# Patient Record
Sex: Female | Born: 2005 | Race: Black or African American | Hispanic: No | Marital: Single | Smoking: Never smoker
Health system: Southern US, Community
[De-identification: ages and names within clinical notes are randomized; demographics above are authoritative.]

## PROBLEM LIST (undated history)

## (undated) ENCOUNTER — Inpatient Hospital Stay (HOSPITAL_COMMUNITY): Payer: Self-pay

## (undated) DIAGNOSIS — A749 Chlamydial infection, unspecified: Secondary | ICD-10-CM

## (undated) DIAGNOSIS — F32A Depression, unspecified: Secondary | ICD-10-CM

## (undated) DIAGNOSIS — F419 Anxiety disorder, unspecified: Secondary | ICD-10-CM

---
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
## 2023-03-11 IMAGING — CT CT ANGIO CHEST
2 of 6 series · 18 of 36 positions shown · IV contrast (OMNIPAQUE 350)
Comparison: None.

CLINICAL DATA: Chest pain and shortness of breath at rest since
[REDACTED]. Positive D-dimer.

EXAM:
CT ANGIOGRAPHY CHEST WITH CONTRAST
TECHNIQUE: Multidetector CT imaging of the chest was performed using the
standard protocol during bolus administration of intravenous
contrast. Multiplanar CT image reconstructions and MIPs were
obtained to evaluate the vascular anatomy.
CONTRAST:  75mL OMNIPAQUE IOHEXOL 350 MG/ML SOLN

[Series 5: thins · axial · 0.56mm/px · z∈[+1648,+1849]mm · 17 of 227 slices shown]
[im 13/227  lung]
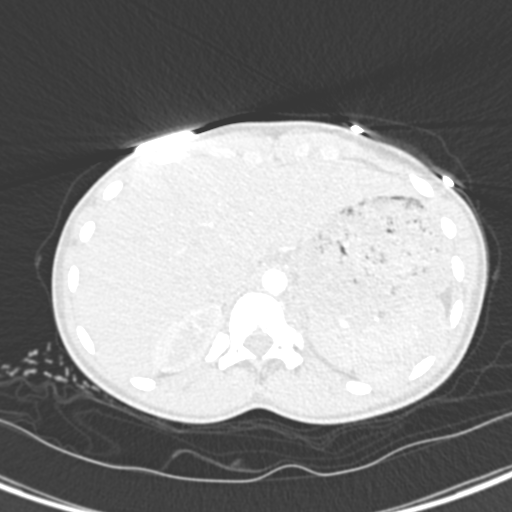
[im 26/227  mediastinal]
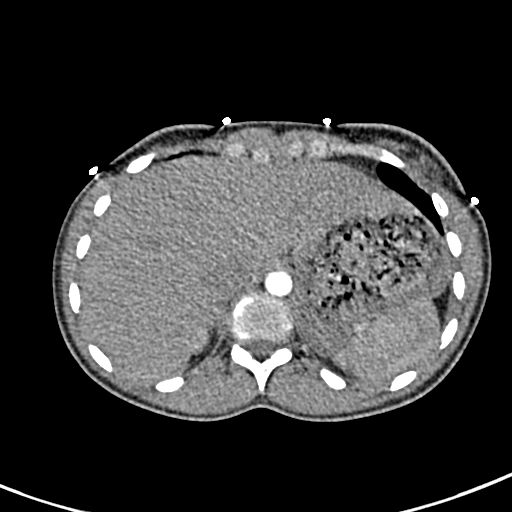
[im 38/227  lung]
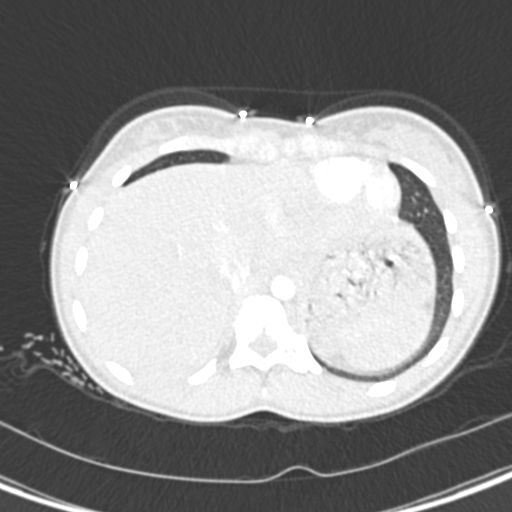
[im 51/227  mediastinal]
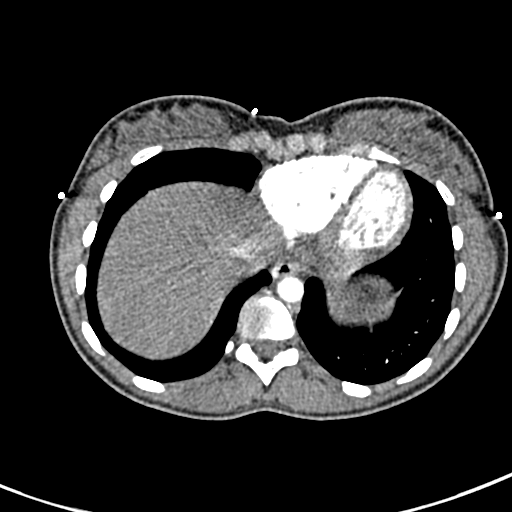
[im 63/227  lung]
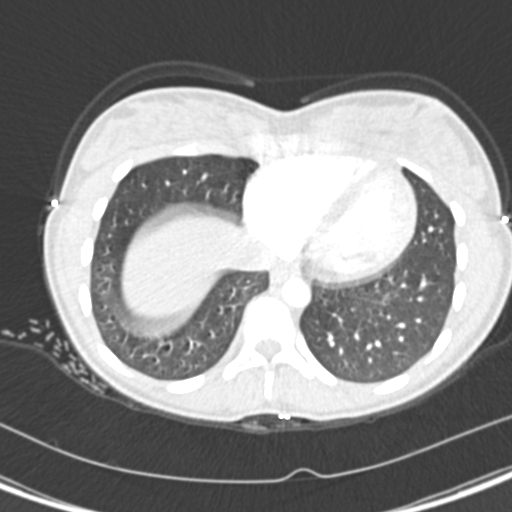
[im 76/227  mediastinal]
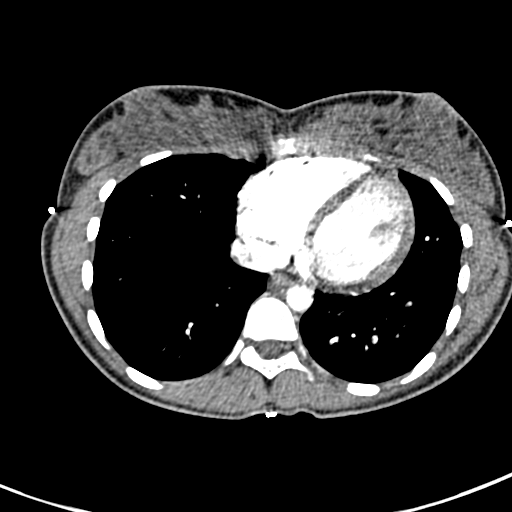
[im 88/227  lung]
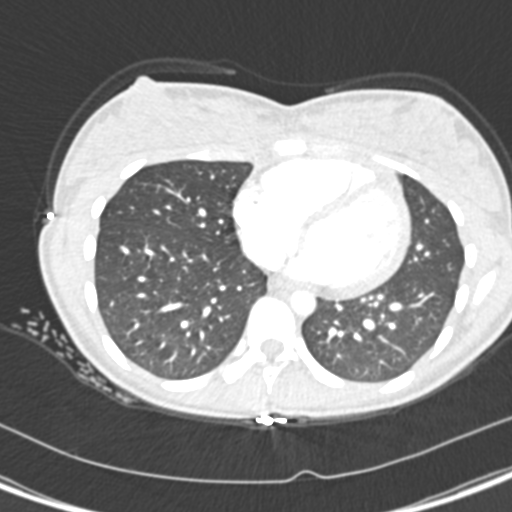
[im 101/227  mediastinal]
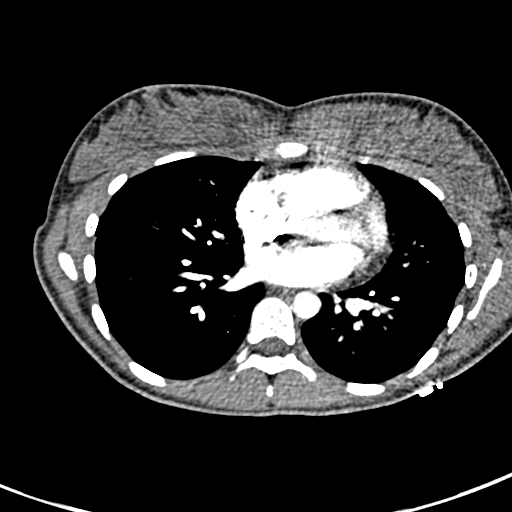
[im 114/227  lung]
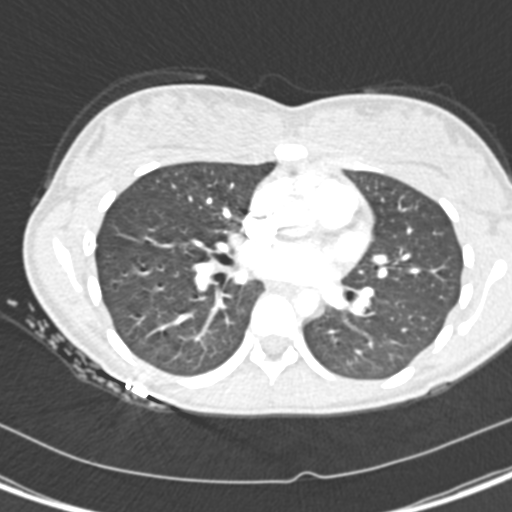
[im 126/227  mediastinal]
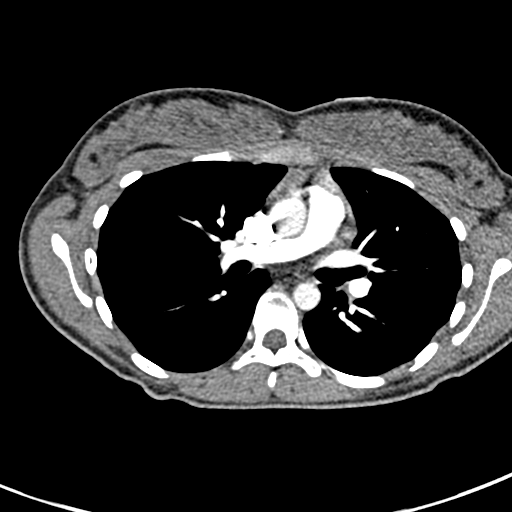
[im 139/227  lung]
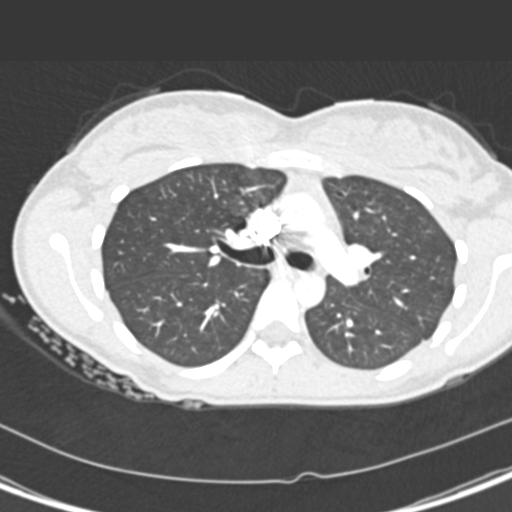
[im 151/227  mediastinal]
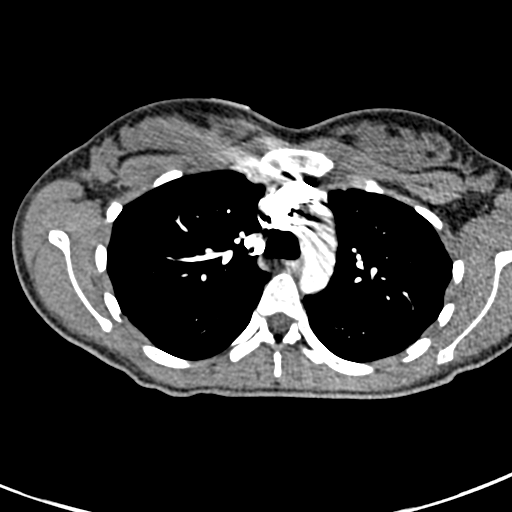
[im 164/227  lung]
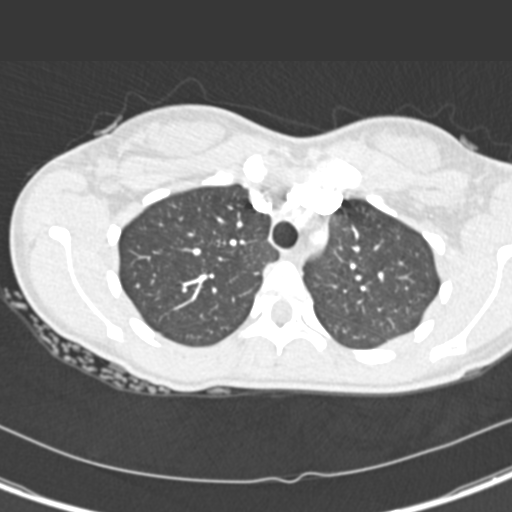
[im 176/227  mediastinal]
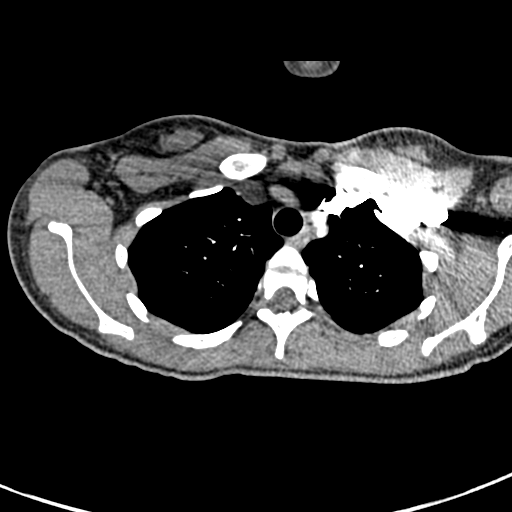
[im 189/227  lung]
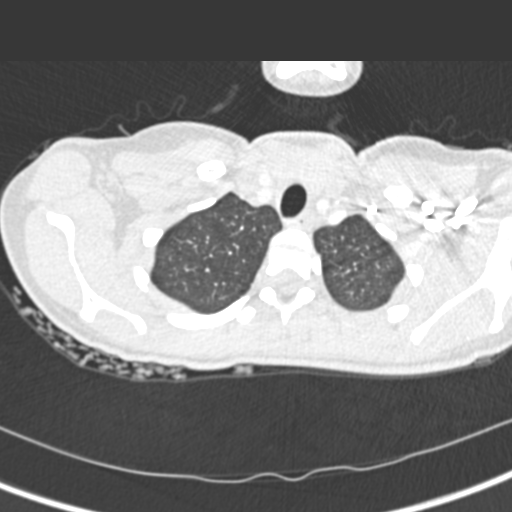
[im 201/227  mediastinal]
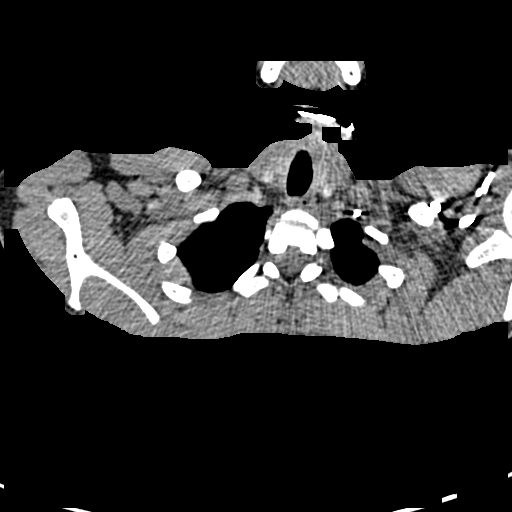
[im 214/227  lung]
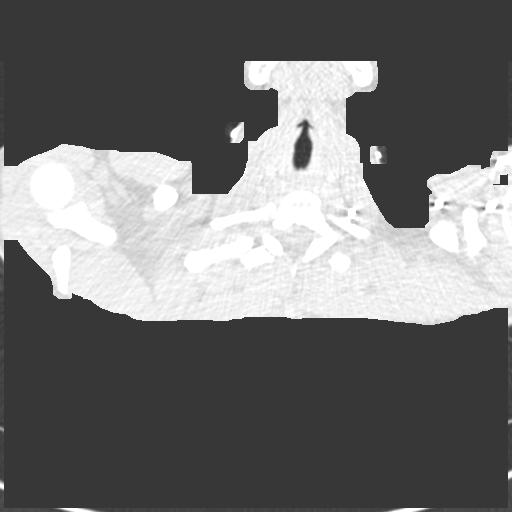

[Series 6: coronal mpr · coronal · 0.50mm/px · 1 of 94 slices shown]
[im 47/94  mediastinal]
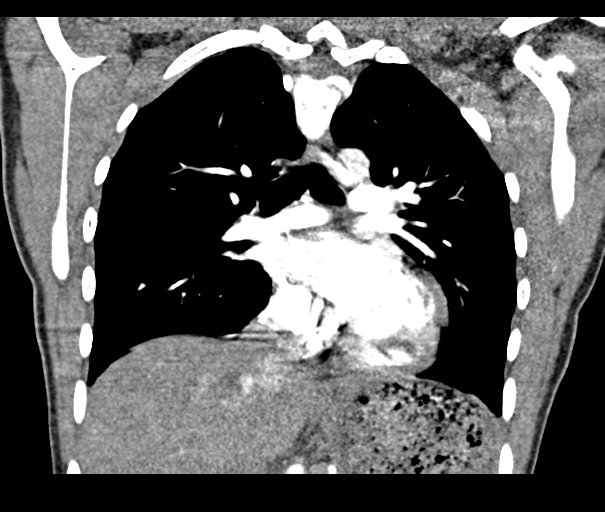

[18 of 36 positions shown; findings below may reference images not displayed]

FINDINGS: Cardiovascular: Satisfactory opacification of the pulmonary arteries
to the segmental level. There is a central low density filling
defect in the proximal lateral segmental right lower lobe pulmonary
artery on image 116/5 which is in an area of respiratory motion. No
additional filling defect visualized. No evidence of right heart
strain. Normal caliber thoracic aorta. Normal heart size. No
pericardial effusion.

Mediastinum/Nodes: No enlarged mediastinal, hilar, or axillary lymph
nodes. Thyroid gland, trachea, and esophagus demonstrate no
significant findings.

Lungs/Pleura: Lungs are clear. No pleural effusion or pneumothorax.

Upper Abdomen: No acute abnormality.

Musculoskeletal: No chest wall abnormality. No acute or significant
osseous findings.

Review of the MIP images confirms the above findings.
IMPRESSION: Small central filling defect in the proximal lateral segmental right
lower lobe pulmonary artery, in a area of respiratory motion which
is favored to represent artifact. No convincing evidence of
pulmonary embolus. No evidence of right heart strain.

## 2023-10-21 NOTE — L&D Delivery Note (Signed)
 Delivery Note Monique Odom is a 18 y.o. G1 now P1 who had a spontaneous delivery at 20:44 of a viable female fetus (Presentation: OP).  APGAR: 9, 9; weight 2680g (5# 14 oz)  Induced with pitocin and AROM. Progressed normally. Received epidural for pain management. Pushed for 20 minutes. Baby was delivered without difficulty. No nuchal cord.  Delayed cord clamping for 60 seconds.  Delivery of placenta was spontaneous. Placenta was found to be intact, 3 -vessel cord was noted. The fundus was found to be firm. First degree perineal laceration was repaired in the normal sterile fashion with 2-0 vicryl. Small cervical laceration on the right was repaired in running fashion with 3-0 Vicryl.  Estimated blood loss 200cc. Cord gases were not sent. Instrument and gauze counts were correct at the end of the procedure. Fundus was firm, bleeding was minimal,  and both mother and baby were doing well when I left the room.   Placenta status: to L&D  Anesthesia:  epidural  Episiotomy:  none Lacerations:  first degree, cervical  Suture Repair: 2.0 vicryl Est. Blood Loss (mL):  200  Mom to postpartum.  Baby to Couplet care / Skin to Skin.  Monique Odom 09/20/2024, 9:15 PM

## 2024-01-23 ENCOUNTER — Inpatient Hospital Stay (HOSPITAL_COMMUNITY)

## 2024-01-23 ENCOUNTER — Encounter (HOSPITAL_COMMUNITY): Payer: Self-pay | Admitting: *Deleted

## 2024-01-23 ENCOUNTER — Inpatient Hospital Stay (HOSPITAL_COMMUNITY)
Admission: AD | Admit: 2024-01-23 | Discharge: 2024-01-23 | Disposition: A | Discharge status: Home / Self Care | Attending: Student | Admitting: Student

## 2024-01-23 DIAGNOSIS — R102 Pelvic and perineal pain: Secondary | ICD-10-CM | POA: Diagnosis not present

## 2024-01-23 DIAGNOSIS — Z3A01 Less than 8 weeks gestation of pregnancy: Secondary | ICD-10-CM | POA: Insufficient documentation

## 2024-01-23 DIAGNOSIS — O26891 Other specified pregnancy related conditions, first trimester: Secondary | ICD-10-CM | POA: Insufficient documentation

## 2024-01-23 DIAGNOSIS — Z3490 Encounter for supervision of normal pregnancy, unspecified, unspecified trimester: Secondary | ICD-10-CM

## 2024-01-23 HISTORY — DX: Depression, unspecified: F32.A

## 2024-01-23 HISTORY — DX: Anxiety disorder, unspecified: F41.9

## 2024-01-23 HISTORY — DX: Chlamydial infection, unspecified: A74.9

## 2024-01-23 LAB — URINALYSIS, ROUTINE W REFLEX MICROSCOPIC
Bilirubin Urine: NEGATIVE
Glucose, UA: NEGATIVE mg/dL
Hgb urine dipstick: NEGATIVE
Ketones, ur: NEGATIVE mg/dL
Leukocytes,Ua: NEGATIVE
Nitrite: NEGATIVE
Protein, ur: NEGATIVE mg/dL
Specific Gravity, Urine: 1.001 — ABNORMAL LOW (ref 1.005–1.030)
pH: 7 (ref 5.0–8.0)

## 2024-01-23 LAB — CBC
HCT: 34.7 % — ABNORMAL LOW (ref 36.0–46.0)
Hemoglobin: 11.7 g/dL — ABNORMAL LOW (ref 12.0–15.0)
MCH: 28.4 pg (ref 26.0–34.0)
MCHC: 33.7 g/dL (ref 30.0–36.0)
MCV: 84.2 fL (ref 80.0–100.0)
Platelets: 290 10*3/uL (ref 150–400)
RBC: 4.12 MIL/uL (ref 3.87–5.11)
RDW: 13.6 % (ref 11.5–15.5)
WBC: 6.1 10*3/uL (ref 4.0–10.5)
nRBC: 0 % (ref 0.0–0.2)

## 2024-01-23 LAB — WET PREP, GENITAL
Sperm: NONE SEEN
Trich, Wet Prep: NONE SEEN
WBC, Wet Prep HPF POC: <10 (ref <10)
Yeast Wet Prep HPF POC: NONE SEEN

## 2024-01-23 LAB — POCT PREGNANCY, URINE: Preg Test, Ur: POSITIVE — AB

## 2024-01-23 LAB — ABO/RH: ABO/RH(D): A POS

## 2024-01-23 LAB — HCG, QUANTITATIVE, PREGNANCY: hCG, Beta Chain, Quant, S: 1545 m[IU]/mL — ABNORMAL HIGH (ref <5)

## 2024-01-23 MED ORDER — PRENATAL PLUS 27-1 MG PO TABS: 1.0000 | ORAL_TABLET | Freq: Every day | ORAL | 3 refills | Status: AC

## 2024-01-23 NOTE — MAU Note (Signed)
 Monique Odom is a 18 y.o. at Unknown here in MAU reporting: cycle was supposed to start yesterday.  Has been cramping for about a wk, feels like her cycle was going to start.  +HPT.  LMP: 3/7 Onset of complaint:  Pain score: mild cramping Vitals:   01/23/24 1626  BP: 130/73  Pulse: 97  Resp: 16  Temp: 98.8 F (37.1 C)  SpO2: 100%     Lab orders placed from triage:  UA/UPT, swabs collected.

## 2024-01-23 NOTE — MAU Provider Note (Signed)
 Cramping  S [name] is a 18 y.o. G29P0010 pregnant female at Unknown who presents to MAU today with complaint of cramping. Pt states menses  should have started yesterday and has been cramping for about a week.  She describes it as if her menses is about to start but denies VB.  LMP 3/7.  Reports +HPT. She's unsure if this is a desired pregnancy.   Pertinent items noted in HPI and remainder of comprehensive ROS otherwise negative.   O BP 130/73 (BP Location: Right Arm)   Pulse 97   Temp 98.8 F (37.1 C) (Oral)   Resp 16   Ht 5\' 2"  (1.575 m)   Wt 49.4 kg   LMP 12/25/2023   SpO2 100%   BMI 19.94 kg/m  Physical Exam Vitals and nursing note reviewed.  Constitutional:      General: She is not in acute distress.    Appearance: She is well-developed and normal weight. She is not ill-appearing.  HENT:     Head: Normocephalic and atraumatic.     Mouth/Throat:     Mouth: Mucous membranes are moist.  Eyes:     Extraocular Movements: Extraocular movements intact.  Cardiovascular:     Rate and Rhythm: Normal rate.  Pulmonary:     Effort: Pulmonary effort is normal. No respiratory distress.  Abdominal:     General: Abdomen is flat.     Palpations: Abdomen is soft.     Tenderness: There is no abdominal tenderness.  Skin:    General: Skin is warm and dry.  Neurological:     Mental Status: She is alert and oriented to person, place, and time.     Motor: No weakness.  Psychiatric:        Mood and Affect: Mood normal.        Behavior: Behavior normal.      MDM: MAU Course: Upreg positive  hCG pending  ABO A+ CBC no leukocytosis, Hgb 11.7  Wet Prep clue cells but no WBC GC collected   [place] = IUP, [redacted]w[redacted]d  AP #[redacted] weeks gestation #Single IUP   Discharge from MAU in stable condition with strict/usual precautions Follow up at desired OBGYN as scheduled for ongoing prenatal care  Allergies as of 01/23/2024   No Known Allergies      Medication List     STOP  taking these medications    naproxen sodium 220 MG tablet Commonly known as: ALEVE       TAKE these medications    lidocaine 2 % solution Commonly known as: XYLOCAINE Use as directed 15 mLs in the mouth or throat as needed for mouth pain.   metroNIDAZOLE 500 MG tablet Commonly known as: FLAGYL Take 1 tablet (500 mg total) by mouth 2 (two) times daily.   prenatal vitamin w/FE, FA 27-1 MG Tabs tablet Take 1 tablet by mouth daily at 12 noon.        Hessie Dibble, MD 01/23/2024 6:36 PM \

## 2024-01-23 NOTE — Discharge Instructions (Signed)

## 2024-01-25 LAB — GC/CHLAMYDIA PROBE AMP (~~LOC~~) NOT AT ARMC
Chlamydia: NEGATIVE
Comment: NEGATIVE
Comment: NORMAL
Neisseria Gonorrhea: NEGATIVE

## 2024-02-05 ENCOUNTER — Inpatient Hospital Stay (HOSPITAL_COMMUNITY)
Admission: AD | Admit: 2024-02-05 | Discharge: 2024-02-05 | Disposition: A | Discharge status: Home / Self Care | Payer: Self-pay | Attending: Obstetrics and Gynecology | Admitting: Obstetrics and Gynecology

## 2024-02-05 ENCOUNTER — Encounter (HOSPITAL_COMMUNITY): Payer: Self-pay | Admitting: Obstetrics and Gynecology

## 2024-02-05 DIAGNOSIS — O210 Mild hyperemesis gravidarum: Secondary | ICD-10-CM | POA: Insufficient documentation

## 2024-02-05 DIAGNOSIS — Z3A01 Less than 8 weeks gestation of pregnancy: Secondary | ICD-10-CM | POA: Insufficient documentation

## 2024-02-05 MED ORDER — ONDANSETRON 4 MG PO TBDP
8.0000 mg | ORAL_TABLET | Freq: Once | ORAL | Status: AC
  Administered 2024-02-05: 8 mg via ORAL
  Filled 2024-02-05: qty 2

## 2024-02-05 MED ORDER — ONDANSETRON 4 MG PO TBDP: 4.0000 mg | ORAL_TABLET | Freq: Three times a day (TID) | ORAL | 2 refills | Status: DC | PRN

## 2024-02-05 NOTE — MAU Note (Signed)
 MAU Triage Note:  .Monique Odom is a 19 y.o. at [redacted]w[redacted]d here in MAU reporting: unable to eat for the past three days d/t nausea and vomiting. 1 episode of emesis today around 1300 after trying to eat some yogurt. Not taking any medications. She is able to tolerate liquids, but not food. She reports mild lower abdominal cramping. Denies VB or LOF. No active vomiting in triage.   Pain Score: 4  Pain Location: Abdomen    LMP: Patient's last menstrual period was 12/25/2023. Onset of complaint: 3 days ago  Vitals:   02/05/24 1629  BP: 96/62  Pulse: 67  Resp: 16  Temp: 98.2 F (36.8 C)  SpO2: 100%     Lab orders placed from triage: UA

## 2024-02-05 NOTE — MAU Provider Note (Signed)
 Chief Complaint: Abdominal Pain and Nausea  SUBJECTIVE HPI: Monique Odom is a 18 y.o. G2P0010 at [redacted]w[redacted]d by early ultrasound who presents to maternity admissions reporting nausea/vomiting.  Patient presents with several days of nausea/vomiting. Has been able to keep down fluids but not solids. Does not have any medications at home. Having crampy abdominal pain and feeling hungry. Denies VB, LOF, change in discharge, urinary symptoms. Has confirmed IUP.  HPI  Past Medical History:  Diagnosis Date   Anxiety    Chlamydia    Depression    Past Surgical History:  Procedure Laterality Date   THERAPEUTIC ABORTION  05/2022   Social History   Socioeconomic History   Marital status: Single    Spouse name: Not on file   Number of children: Not on file   Years of education: Not on file   Highest education level: Not on file  Occupational History   Not on file  Tobacco Use   Smoking status: Never   Smokeless tobacco: Never  Vaping Use   Vaping status: Every Day  Substance and Sexual Activity   Alcohol use: Yes    Comment: weekends   Drug use: Yes    Types: Marijuana    Comment: 1/3 days   Sexual activity: Yes    Birth control/protection: Pill    Comment: stopped fall '24  Other Topics Concern   Not on file  Social History Narrative   Not on file   Social Drivers of Health   Financial Resource Strain: Not on file  Food Insecurity: Not on file  Transportation Needs: Not on file  Physical Activity: Not on file  Stress: Not on file  Social Connections: Not on file  Intimate Partner Violence: Not on file   No current facility-administered medications on file prior to encounter.   Current Outpatient Medications on File Prior to Encounter  Medication Sig Dispense Refill   lidocaine  (XYLOCAINE ) 2 % solution Use as directed 15 mLs in the mouth or throat as needed for mouth pain. 50 mL 0   metroNIDAZOLE  (FLAGYL ) 500 MG tablet Take 1 tablet (500 mg total) by mouth 2 (two)  times daily. 14 tablet 0   prenatal vitamin w/FE, FA (PRENATAL 1 + 1) 27-1 MG TABS tablet Take 1 tablet by mouth daily at 12 noon. 30 tablet 3   No Known Allergies  ROS:  Pertinent positives/negatives listed above.  I have reviewed patient's Past Medical Hx, Surgical Hx, Family Hx, Social Hx, medications and allergies.   Physical Exam  Patient Vitals for the past 24 hrs:  BP Temp Temp src Pulse Resp SpO2 Height Weight  02/05/24 1629 96/62 98.2 F (36.8 C) Oral 67 16 100 % -- --  02/05/24 1625 -- -- -- -- -- -- 5\' 2"  (1.575 m) 48.8 kg   Constitutional: Well-developed, well-nourished female in no acute distress.  Cardiovascular: normal rate Respiratory: normal effort GI: Abd soft, non-tender MS: Extremities nontender, no edema, normal ROM Neurologic: Alert and oriented x 4 GU: Neg CVAT  LAB RESULTS No results found for this or any previous visit (from the past 24 hours).  --/--/A POS (04/05 1650)  IMAGING US  OB LESS THAN 14 WEEKS WITH OB TRANSVAGINAL Result Date: 01/23/2024 CLINICAL DATA:  Pelvic pain and cramping in 1st trimester pregnancy. EXAM: OBSTETRIC <14 WK US  AND TRANSVAGINAL OB US  TECHNIQUE: Both transabdominal and transvaginal ultrasound examinations were performed for complete evaluation of the gestation as well as the maternal uterus, adnexal regions, and pelvic cul-de-sac.  Transvaginal technique was performed to assess early pregnancy. COMPARISON:  None Available. FINDINGS: Intrauterine gestational sac: Single, with double decidual sac sign noted Yolk sac:  Not Visualized. Embryo:  Not Visualized. MSD: 3 mm   5 w   0 d Subchorionic hemorrhage:  None visualized. Maternal uterus/adnexae: Retroverted uterus. No fibroids identified. 2.4 cm simple right ovarian cyst. Normal appearance of left ovary. No suspicious adnexal mass or abnormal free fluid identified. IMPRESSION: Single early approximately 5 week intrauterine gestational sac. Consider correlation with serial b-hCG  levels, and followup ultrasound to assess viability in 10-14 days. Electronically Signed   By: Marlyce Sine M.D.   On: 01/23/2024 18:02    MAU Management/MDM: Orders Placed This Encounter  Procedures   Urinalysis, Routine w reflex microscopic -Urine, Clean Catch   Discharge patient    Meds ordered this encounter  Medications   ondansetron  (ZOFRAN -ODT) disintegrating tablet 8 mg   ondansetron  (ZOFRAN -ODT) 4 MG disintegrating tablet    Sig: Take 1 tablet (4 mg total) by mouth every 8 (eight) hours as needed.    Dispense:  60 tablet    Refill:  2    Patient presents with N>V at [redacted]w[redacted]d of pregnancy. Consistent with morning sickness. Benign abdomen and normal vital signs point against other intra-abdominal cause. Patient's symptoms resolved with zofran  and was able to keep down saltines. Will discharge home with same. Encouraged gummy PNV as she's not able to keep down pills.  ASSESSMENT 1. Morning sickness   2. [redacted] weeks gestation of pregnancy     PLAN Discharge home with strict return precautions. Allergies as of 02/05/2024   No Known Allergies      Medication List     TAKE these medications    lidocaine  2 % solution Commonly known as: XYLOCAINE  Use as directed 15 mLs in the mouth or throat as needed for mouth pain.   metroNIDAZOLE  500 MG tablet Commonly known as: FLAGYL  Take 1 tablet (500 mg total) by mouth 2 (two) times daily.   ondansetron  4 MG disintegrating tablet Commonly known as: ZOFRAN -ODT Take 1 tablet (4 mg total) by mouth every 8 (eight) hours as needed.   prenatal vitamin w/FE, FA 27-1 MG Tabs tablet Take 1 tablet by mouth daily at 12 noon.         Authur Leghorn, MD OB Fellow 02/05/2024  5:19 PM

## 2024-03-02 LAB — HEPATITIS C ANTIBODY: HCV Ab: NEGATIVE

## 2024-03-02 LAB — OB RESULTS CONSOLE RPR: RPR: NONREACTIVE

## 2024-03-02 LAB — OB RESULTS CONSOLE HEPATITIS B SURFACE ANTIGEN: Hepatitis B Surface Ag: NEGATIVE

## 2024-03-02 LAB — OB RESULTS CONSOLE RUBELLA ANTIBODY, IGM: Rubella: IMMUNE

## 2024-03-02 LAB — OB RESULTS CONSOLE HIV ANTIBODY (ROUTINE TESTING): HIV: NONREACTIVE

## 2024-07-08 ENCOUNTER — Other Ambulatory Visit (HOSPITAL_COMMUNITY): Payer: Self-pay | Admitting: Obstetrics and Gynecology

## 2024-07-08 DIAGNOSIS — O99013 Anemia complicating pregnancy, third trimester: Secondary | ICD-10-CM | POA: Insufficient documentation

## 2024-07-08 DIAGNOSIS — D508 Other iron deficiency anemias: Secondary | ICD-10-CM | POA: Insufficient documentation

## 2024-07-14 ENCOUNTER — Telehealth: Payer: Self-pay | Admitting: Pharmacy Technician

## 2024-07-14 NOTE — Telephone Encounter (Signed)
 Dr. Okey, Monique Odom is non preferred and has been denied. Preferred medication is Venofer. Would you like to try Venofer?  Auth Submission: DENIED Site of care: Site of care: [facility] Payer: [employer] Medication & CPT/J Code(s) submitted: Venofer (Iron Sucrose) J1756 Diagnosis Code:  Route of submission (phone, fax, portal):  Phone # Fax # Auth type: Buy/Bill PB Units/visits requested:  Reference number:  Approval from:  to

## 2024-07-15 ENCOUNTER — Encounter (HOSPITAL_COMMUNITY): Payer: Self-pay | Admitting: Obstetrics and Gynecology

## 2024-07-18 ENCOUNTER — Other Ambulatory Visit (HOSPITAL_COMMUNITY): Payer: Self-pay | Admitting: Pharmacy Technician

## 2024-07-18 ENCOUNTER — Telehealth: Payer: Self-pay

## 2024-07-18 ENCOUNTER — Telehealth (HOSPITAL_COMMUNITY): Payer: Self-pay

## 2024-07-18 NOTE — Telephone Encounter (Signed)
 LVM for patient to return call [facility]

## 2024-07-18 NOTE — Telephone Encounter (Signed)
 Auth Submission: NO AUTH NEEDED Site of care: Site of care: [facility] Payer: [employer], [employer] Medication & CPT/J Code(s) submitted: Venofer (Iron Sucrose) J1756 Diagnosis Code: O99.013, D50.8 Route of submission (phone, fax, portal):  Phone # Fax # Auth type: Buy/Bill HB Units/visits requested: 300mg  x 3 doses Reference number:  Approval from: 07/18/24 to 10/19/24

## 2024-07-19 ENCOUNTER — Other Ambulatory Visit (HOSPITAL_COMMUNITY): Payer: Self-pay | Admitting: Pharmacy Technician

## 2024-07-19 ENCOUNTER — Encounter (HOSPITAL_COMMUNITY): Payer: Self-pay | Admitting: Obstetrics and Gynecology

## 2024-07-21 ENCOUNTER — Inpatient Hospital Stay (HOSPITAL_COMMUNITY): Admission: AD | Admit: 2024-07-21 | Discharge: 2024-07-21 | Disposition: A | Discharge status: Home / Self Care

## 2024-07-21 ENCOUNTER — Encounter (HOSPITAL_COMMUNITY): Payer: Self-pay

## 2024-07-21 DIAGNOSIS — R519 Headache, unspecified: Secondary | ICD-10-CM | POA: Insufficient documentation

## 2024-07-21 DIAGNOSIS — O26893 Other specified pregnancy related conditions, third trimester: Secondary | ICD-10-CM

## 2024-07-21 DIAGNOSIS — M791 Myalgia, unspecified site: Secondary | ICD-10-CM

## 2024-07-21 DIAGNOSIS — Z3689 Encounter for other specified antenatal screening: Secondary | ICD-10-CM | POA: Diagnosis not present

## 2024-07-21 DIAGNOSIS — Z3A30 30 weeks gestation of pregnancy: Secondary | ICD-10-CM | POA: Diagnosis not present

## 2024-07-21 LAB — WET PREP, GENITAL
Clue Cells Wet Prep HPF POC: NONE SEEN
Sperm: NONE SEEN
Trich, Wet Prep: NONE SEEN
WBC, Wet Prep HPF POC: <10 (ref <10)
Yeast Wet Prep HPF POC: NONE SEEN

## 2024-07-21 LAB — URINALYSIS, ROUTINE W REFLEX MICROSCOPIC
Bilirubin Urine: NEGATIVE
Glucose, UA: NEGATIVE mg/dL
Hgb urine dipstick: NEGATIVE
Ketones, ur: NEGATIVE mg/dL
Leukocytes,Ua: NEGATIVE
Nitrite: NEGATIVE
Protein, ur: NEGATIVE mg/dL
Specific Gravity, Urine: 1.015 (ref 1.005–1.030)
pH: 8.5 — ABNORMAL HIGH (ref 5.0–8.0)

## 2024-07-21 MED ORDER — ACETAMINOPHEN-CAFFEINE 500-65 MG PO TABS: 2.0000 | ORAL_TABLET | Freq: Four times a day (QID) | ORAL | 0 refills | Status: DC | PRN

## 2024-07-21 MED ORDER — ACETAMINOPHEN-CAFFEINE 500-65 MG PO TABS
2.0000 | ORAL_TABLET | Freq: Once | ORAL | Status: AC
  Administered 2024-07-21: 2 via ORAL
  Filled 2024-07-21: qty 2

## 2024-07-21 MED ORDER — CYCLOBENZAPRINE HCL 10 MG PO TABS: 10.0000 mg | ORAL_TABLET | Freq: Two times a day (BID) | ORAL | 0 refills | Status: DC | PRN

## 2024-07-21 MED ORDER — CYCLOBENZAPRINE HCL 5 MG PO TABS
10.0000 mg | ORAL_TABLET | Freq: Once | ORAL | Status: AC
  Administered 2024-07-21: 10 mg via ORAL
  Filled 2024-07-21: qty 2

## 2024-07-21 NOTE — MAU Note (Signed)
 Monique Odom is a 18 y.o. at [redacted]w[redacted]d here in MAU reporting: last night was in a lot of pain.  Woke up this morning, everything hurts, even lying down; her back,her pelvis, her head. Did not take anything for pain. When she walks it is even worse.  Is feeling pressure in her butt. Pain is constant.  No bleeding or LOF.  Reports +FM.  Onset of complaint: last night  Pain score: back 6, pelvis 6, head 6 Vitals:   07/21/24 1221  BP: 110/69  Pulse: 88  Resp: 15  Temp: 98.5 F (36.9 C)  SpO2: 98%     FHT:152 Lab orders placed from triage:  urine

## 2024-07-21 NOTE — MAU Provider Note (Signed)
 Chief Complaint:  Back Pain, Pelvic Pain, and Headache   HPI    Monique Odom is a 18 y.o. G2P0010 at [redacted]w[redacted]d who presents to maternity admissions reporting last night she stared to have body aches.  Patient states that she is having pain in her extremity's , a headache that she rates at 6 out of 10 now resolving. Patient unable to verbalize exact location of pain just reports all over and she states she has not taken anything for her pain as of yet. She denies any VB, LOF, reports occasional ctx, and a mucoid vaginal discharge that is not watery and denies any burning or vaginal itching/irration.  Pregnancy Course: Monique Odom  Past Medical History:  Diagnosis Date   Anxiety    Chlamydia    Depression    OB History  Gravida Para Term Preterm AB Living  2    1   SAB IAB Ectopic Multiple Live Births          # Outcome Date GA Lbr Len/2nd Weight Sex Type Anes PTL Lv  2 Current           1 AB 05/2022            Obstetric Comments  2023 procedure, bled for a long time after   Past Surgical History:  Procedure Laterality Date   THERAPEUTIC ABORTION  05/2022   Family History  Problem Relation Age of Onset   Hypertension Mother    Other Father        unknown   Social History   Tobacco Use   Smoking status: Never   Smokeless tobacco: Never  Vaping Use   Vaping status: Former  Substance Use Topics   Alcohol use: Not Currently    Comment: weekends   Drug use: Not Currently    Types: Marijuana    Comment: stopped before pregnancy   No Known Allergies Medications Prior to Admission  Medication Sig Dispense Refill Last Dose/Taking   ondansetron  (ZOFRAN -ODT) 4 MG disintegrating tablet Take 1 tablet (4 mg total) by mouth every 8 (eight) hours as needed. 60 tablet 2 Past Month   prenatal vitamin w/FE, FA (PRENATAL 1 + 1) 27-1 MG TABS tablet Take 1 tablet by mouth daily at 12 noon. 30 tablet 3 07/21/2024   lidocaine  (XYLOCAINE ) 2 % solution Use as directed 15 mLs in the  mouth or throat as needed for mouth pain. (Patient not taking: Reported on 07/21/2024) 50 mL 0 Not Taking   metroNIDAZOLE  (FLAGYL ) 500 MG tablet Take 1 tablet (500 mg total) by mouth 2 (two) times daily. (Patient not taking: Reported on 07/21/2024) 14 tablet 0 Not Taking    I have reviewed patient's Past Medical Hx, Surgical Hx, Family Hx, Social Hx, medications and allergies.   ROS  Pertinent items noted in HPI and remainder of comprehensive ROS otherwise negative.   PHYSICAL EXAM  Patient Vitals for the past 24 hrs:  BP Temp Temp src Pulse Resp SpO2 Height Weight  07/21/24 1221 110/69 98.5 F (36.9 C) Oral 88 15 98 % 5' 2 (1.575 m) 55.1 kg    Constitutional: Well-developed, well-nourished female in no acute distress.  Cardiovascular: normal rate & rhythm, warm and well-perfused Respiratory: normal effort, no problems with respiration noted GI: Abd soft, non-tender, gravid with no ct palpable MS: Extremities nontender, no edema, normal ROM Neurologic: Alert and oriented x 4.  GU: no CVA tenderness Pelvic: chaperoned by Nathanel Blush, RN  SVE Dilation: 1 Effacement (%): Thick  Cervical Position: Posterior Exam by:: L.Chesney Suares,NP1/thick/long  ( vaginal swabs collected)  Fetal Tracing: Reactive NST Baseline: 135-140 Variability: moderate  Accelerations: present Decelerations: absent Toco: NO CTX   Labs: Results for orders placed or performed during the hospital encounter of 07/21/24 (from the past 24 hours)  Urinalysis, Routine w reflex microscopic -Urine, Clean Catch     Status: Abnormal   Collection Time: 07/21/24 12:36 PM  Result Value Ref Range   Color, Urine YELLOW YELLOW   APPearance CLEAR CLEAR   Specific Gravity, Urine 1.015 1.005 - 1.030   pH 8.5 (H) 5.0 - 8.0   Glucose, UA NEGATIVE NEGATIVE mg/dL   Hgb urine dipstick NEGATIVE NEGATIVE   Bilirubin Urine NEGATIVE NEGATIVE   Ketones, ur NEGATIVE NEGATIVE mg/dL   Protein, ur NEGATIVE NEGATIVE mg/dL   Nitrite NEGATIVE  NEGATIVE   Leukocytes,Ua NEGATIVE NEGATIVE  Wet prep, genital     Status: None   Collection Time: 07/21/24  2:00 PM  Result Value Ref Range   Yeast Wet Prep HPF POC NONE SEEN NONE SEEN   Trich, Wet Prep NONE SEEN NONE SEEN   Clue Cells Wet Prep HPF POC NONE SEEN NONE SEEN   WBC, Wet Prep HPF POC <10 <10   Sperm NONE SEEN     Imaging:  No results found.  MDM & MAU COURSE  MDM:  HIGH  Prenatal chart reviewed Physical exam performed with pelvic Vaginal swabs obtained: Wet Prep: Negative, GC pending at discharge UA: No evidence of UTI Flexeril for likely musculoskeletal pain and Excedrin for headache prn NST reactive for GA  After patient received medication reassessed at 1442 and she reports feeling much better. HA has resolved and likely MSK pain in pregnancy.   Will plan for discharge with RX for Flexeril and Excedrin prn     MAU Course: Orders Placed This Encounter  Procedures   Wet prep, genital   Urinalysis, Routine w reflex microscopic -Urine, Clean Catch   Discharge patient Discharge disposition: 01-Home or Self Care; Discharge patient date: 07/21/2024   Meds ordered this encounter  Medications   cyclobenzaprine (FLEXERIL) tablet 10 mg   acetaminophen-caffeine (EXCEDRIN TENSION HEADACHE) 500-65 MG per tablet 2 tablet   acetaminophen-caffeine (EXCEDRIN TENSION HEADACHE) 500-65 MG TABS per tablet    Sig: Take 2 tablets by mouth 4 (four) times daily as needed (Foe headaches).    Dispense:  60 tablet    Refill:  0    Supervising Provider:   PRATT, TANYA S [[id number]]   cyclobenzaprine (FLEXERIL) 10 MG tablet    Sig: Take 1 tablet (10 mg total) by mouth 2 (two) times daily as needed for muscle spasms.    Dispense:  20 tablet    Refill:  0    Supervising Provider:   PRATT, TANYA S [[id number]]      I have reviewed the patient chart and performed the physical exam . I have ordered & interpreted the lab results and reviewed and interpreted the NST Medications ordered as  stated below.  A/P as described below.  Counseling and education provided and patient agreeable  with plan as described below. Verbalized understanding.    ASSESSMENT   1. Muscular pain   2. Nonintractable headache, unspecified chronicity pattern, unspecified headache type   3. [redacted] weeks gestation of pregnancy   4. NST (non-stress test) reactive on fetal surveillance     PLAN  Discharge home in stable condition with return precautions.   See AVS for full description of information  given to the patient including both verbal and written. Patient verbalized understanding and agrees with the plan as described above.     Follow-up Information     Ob/Gyn, [facility] Follow up.   Why: If symptoms worsen or fail to resolve, As scheduled for ongoing prenatal care Contact information: [address] [place] [address] [phone]                 Allergies as of 07/21/2024   No Known Allergies      Medication List     TAKE these medications    acetaminophen-caffeine 500-65 MG Tabs per tablet Commonly known as: EXCEDRIN TENSION HEADACHE Take 2 tablets by mouth 4 (four) times daily as needed (Foe headaches).   cyclobenzaprine 10 MG tablet Commonly known as: FLEXERIL Take 1 tablet (10 mg total) by mouth 2 (two) times daily as needed for muscle spasms.   lidocaine  2 % solution Commonly known as: XYLOCAINE  Use as directed 15 mLs in the mouth or throat as needed for mouth pain.   metroNIDAZOLE  500 MG tablet Commonly known as: FLAGYL  Take 1 tablet (500 mg total) by mouth 2 (two) times daily.   ondansetron  4 MG disintegrating tablet Commonly known as: ZOFRAN -ODT Take 1 tablet (4 mg total) by mouth every 8 (eight) hours as needed.   prenatal vitamin w/FE, FA 27-1 MG Tabs tablet Take 1 tablet by mouth daily at 12 noon.        Olam Dalton, MSN, [facility], [facility] for [facility]    This chart was dictated using voice  recognition software, Dragon. Despite the best efforts of this provider to proofread and correct errors, errors may still occur which can change documentation meaning.

## 2024-07-22 LAB — GC/CHLAMYDIA PROBE AMP (~~LOC~~) NOT AT ARMC
Chlamydia: NEGATIVE
Comment: NEGATIVE
Comment: NORMAL
Neisseria Gonorrhea: NEGATIVE

## 2024-07-27 ENCOUNTER — Ambulatory Visit (HOSPITAL_COMMUNITY)
Admission: RE | Admit: 2024-07-27 | Discharge: 2024-07-27 | Disposition: A | Discharge status: Home / Self Care | Source: Ambulatory Visit | Attending: Internal Medicine | Admitting: Internal Medicine

## 2024-07-27 VITALS — BP 103/58 | HR 95 | Temp 98.5°F | Resp 16

## 2024-07-27 DIAGNOSIS — D508 Other iron deficiency anemias: Secondary | ICD-10-CM | POA: Diagnosis present

## 2024-07-27 DIAGNOSIS — O99013 Anemia complicating pregnancy, third trimester: Secondary | ICD-10-CM | POA: Insufficient documentation

## 2024-07-27 MED ORDER — IRON SUCROSE 300 MG IVPB - SIMPLE MED
300.0000 mg | Freq: Once | Status: AC
  Administered 2024-07-27: 300 mg via INTRAVENOUS
  Filled 2024-07-27: qty 300

## 2024-07-27 MED ORDER — SODIUM CHLORIDE 0.9 % IV SOLN: INTRAVENOUS | Status: DC | PRN

## 2024-07-27 NOTE — Progress Notes (Signed)
 PATIENT CARE CENTER NOTE:  Diagnosis: anemia complicating pregnancy in third trimester  Provider: Marjorie Gull MD  Procedure: Venofer 300mg  infusion  Patient received Venofer ( dose #1 of 3) via PIV per therapy plan orders. No premeds required. Patient only stayed for 20 minutes of post infusion observation due to transportation. Tolerated well, no adverse reaction noted, vitals stable, discharge instructions given , verbalized understanding. Pt to RTC on 10/14, reviewed date and time with pt, verbalized understanding. Patient alert, oriented, and ambulatory at the time of discharge accompanied by significant other.

## 2024-08-02 ENCOUNTER — Ambulatory Visit (HOSPITAL_COMMUNITY)

## 2024-08-11 ENCOUNTER — Ambulatory Visit (HOSPITAL_COMMUNITY)
Admission: RE | Admit: 2024-08-11 | Discharge: 2024-08-11 | Disposition: A | Discharge status: Home / Self Care | Source: Ambulatory Visit | Attending: Internal Medicine | Admitting: Internal Medicine

## 2024-08-11 VITALS — BP 112/66 | HR 100 | Temp 97.9°F | Resp 16

## 2024-08-11 DIAGNOSIS — O99013 Anemia complicating pregnancy, third trimester: Secondary | ICD-10-CM

## 2024-08-11 DIAGNOSIS — D508 Other iron deficiency anemias: Secondary | ICD-10-CM

## 2024-08-11 MED ORDER — IRON SUCROSE 300 MG IVPB - SIMPLE MED
300.0000 mg | Freq: Once | Status: AC
  Administered 2024-08-11: 300 mg via INTRAVENOUS
  Filled 2024-08-11: qty 300

## 2024-08-11 MED ORDER — SODIUM CHLORIDE 0.9 % IV SOLN: INTRAVENOUS | Status: DC | PRN

## 2024-08-11 NOTE — Progress Notes (Signed)
 Diagnosis: Anemia complication pregnancy in third trimester    Provider: Marjorie Gull MD    Procedure: Venofer 300 mg infusion    Note: patient received Venofer ( dose 2of 3) via PIV LAC. No premeds given , no adverse reaction during administration.  Patient tolerated and didn't stay 30 minutes post observation. Patient ambulatory upon discharge, left POV with her husband.  Patient to make follow up appt prior to leaving .

## 2024-08-18 ENCOUNTER — Encounter (HOSPITAL_COMMUNITY): Payer: Self-pay | Admitting: Obstetrics and Gynecology

## 2024-08-18 ENCOUNTER — Inpatient Hospital Stay (HOSPITAL_COMMUNITY): Admission: RE | Admit: 2024-08-18 | Source: Ambulatory Visit

## 2024-09-09 LAB — OB RESULTS CONSOLE GBS: GBS: NEGATIVE

## 2024-09-15 ENCOUNTER — Other Ambulatory Visit: Payer: Self-pay

## 2024-09-15 ENCOUNTER — Inpatient Hospital Stay (HOSPITAL_COMMUNITY)
Admission: AD | Admit: 2024-09-15 | Discharge: 2024-09-16 | Disposition: A | Discharge status: Home / Self Care | Attending: Obstetrics and Gynecology | Admitting: Obstetrics and Gynecology

## 2024-09-15 ENCOUNTER — Encounter (HOSPITAL_COMMUNITY): Payer: Self-pay | Admitting: Obstetrics and Gynecology

## 2024-09-15 DIAGNOSIS — O471 False labor at or after 37 completed weeks of gestation: Secondary | ICD-10-CM | POA: Insufficient documentation

## 2024-09-15 DIAGNOSIS — Z3689 Encounter for other specified antenatal screening: Secondary | ICD-10-CM | POA: Insufficient documentation

## 2024-09-15 DIAGNOSIS — Z3A38 38 weeks gestation of pregnancy: Secondary | ICD-10-CM | POA: Insufficient documentation

## 2024-09-16 DIAGNOSIS — Z3689 Encounter for other specified antenatal screening: Secondary | ICD-10-CM | POA: Diagnosis not present

## 2024-09-16 DIAGNOSIS — O471 False labor at or after 37 completed weeks of gestation: Secondary | ICD-10-CM | POA: Diagnosis not present

## 2024-09-16 DIAGNOSIS — Z3A38 38 weeks gestation of pregnancy: Secondary | ICD-10-CM | POA: Diagnosis not present

## 2024-09-16 NOTE — MAU Provider Note (Signed)
 S: [name] is a 18 y.o. G2P0010 at [redacted]w[redacted]d  who presents to MAU today for labor evaluation.   Nurse reports patient without cervical change after 1 hr.  Strip reviewed.   Cervical exam by RN:  Dilation: 3.5 Effacement (%): 40 Station: -3 Presentation: Vertex Exam by:: Sansever[place], RN  Fetal Monitoring: Baseline: 150 Variability: Moderate Accelerations: Present Decelerations: One Early Contractions: Irregular  MDM Discussed patient with RN. NST reviewed.   A: SIUP at [redacted]w[redacted]d  False labor  P: Discharge home. Patient to follow-up with primary office as scheduled. Patient may return to MAU as needed or when in labor.  Synthia Raisin, CNM 09/16/2024 12:27 AM

## 2024-09-20 ENCOUNTER — Inpatient Hospital Stay (HOSPITAL_COMMUNITY): Admitting: Anesthesiology

## 2024-09-20 ENCOUNTER — Encounter (HOSPITAL_COMMUNITY): Payer: Self-pay

## 2024-09-20 ENCOUNTER — Inpatient Hospital Stay (HOSPITAL_COMMUNITY): Admission: AD | Admit: 2024-09-20 | Discharge: 2024-09-22 | DRG: 806 | Disposition: A

## 2024-09-20 ENCOUNTER — Other Ambulatory Visit: Payer: Self-pay

## 2024-09-20 DIAGNOSIS — O365990 Maternal care for other known or suspected poor fetal growth, unspecified trimester, not applicable or unspecified: Principal | ICD-10-CM | POA: Diagnosis present

## 2024-09-20 DIAGNOSIS — O365930 Maternal care for other known or suspected poor fetal growth, third trimester, not applicable or unspecified: Principal | ICD-10-CM | POA: Diagnosis present

## 2024-09-20 DIAGNOSIS — O9081 Anemia of the puerperium: Secondary | ICD-10-CM | POA: Diagnosis not present

## 2024-09-20 DIAGNOSIS — Z3A38 38 weeks gestation of pregnancy: Secondary | ICD-10-CM

## 2024-09-20 DIAGNOSIS — D62 Acute posthemorrhagic anemia: Secondary | ICD-10-CM | POA: Diagnosis not present

## 2024-09-20 DIAGNOSIS — Z8249 Family history of ischemic heart disease and other diseases of the circulatory system: Secondary | ICD-10-CM

## 2024-09-20 LAB — CBC
HCT: 32.3 % — ABNORMAL LOW (ref 36.0–46.0)
Hemoglobin: 10.6 g/dL — ABNORMAL LOW (ref 12.0–15.0)
MCH: 27.9 pg (ref 26.0–34.0)
MCHC: 32.8 g/dL (ref 30.0–36.0)
MCV: 85 fL (ref 80.0–100.0)
Platelets: 224 K/uL (ref 150–400)
RBC: 3.8 MIL/uL — ABNORMAL LOW (ref 3.87–5.11)
RDW: 16.7 % — ABNORMAL HIGH (ref 11.5–15.5)
WBC: 5.5 K/uL (ref 4.0–10.5)
nRBC: 0 % (ref 0.0–0.2)

## 2024-09-20 LAB — TYPE AND SCREEN
ABO/RH(D): A POS
Antibody Screen: NEGATIVE

## 2024-09-20 MED ORDER — LIDOCAINE HCL (PF) 1 % IJ SOLN: 30.0000 mL | INTRAMUSCULAR | Status: DC | PRN

## 2024-09-20 MED ORDER — ACETAMINOPHEN 325 MG PO TABS
650.0000 mg | ORAL_TABLET | ORAL | Status: DC | PRN
  Administered 2024-09-21: 650 mg via ORAL
  Filled 2024-09-20: qty 2

## 2024-09-20 MED ORDER — DOCUSATE SODIUM 100 MG PO CAPS
100.0000 mg | ORAL_CAPSULE | Freq: Two times a day (BID) | ORAL | Status: DC
  Administered 2024-09-21 – 2024-09-22 (×2): 100 mg via ORAL
  Filled 2024-09-20 (×2): qty 1

## 2024-09-20 MED ORDER — COCONUT OIL OIL
1.0000 | TOPICAL_OIL | Status: DC | PRN
  Administered 2024-09-21 (×2): 1 via TOPICAL

## 2024-09-20 MED ORDER — OXYCODONE HCL 5 MG PO TABS: 10.0000 mg | ORAL_TABLET | ORAL | Status: DC | PRN

## 2024-09-20 MED ORDER — ACETAMINOPHEN 325 MG PO TABS: 650.0000 mg | ORAL_TABLET | ORAL | Status: DC | PRN

## 2024-09-20 MED ORDER — BISACODYL 10 MG RE SUPP: 10.0000 mg | Freq: Every day | RECTAL | Status: DC | PRN

## 2024-09-20 MED ORDER — TERBUTALINE SULFATE 1 MG/ML IJ SOLN: 0.2500 mg | Freq: Once | INTRAMUSCULAR | Status: DC | PRN

## 2024-09-20 MED ORDER — FLEET ENEMA RE ENEM: 1.0000 | ENEMA | Freq: Every day | RECTAL | Status: DC | PRN

## 2024-09-20 MED ORDER — LIDOCAINE HCL (PF) 1 % IJ SOLN
INTRAMUSCULAR | Status: DC | PRN
  Administered 2024-09-20 (×2): 4 mL via EPIDURAL

## 2024-09-20 MED ORDER — LACTATED RINGERS IV SOLN: INTRAVENOUS | Status: DC

## 2024-09-20 MED ORDER — OXYTOCIN BOLUS FROM INFUSION
333.0000 mL | Freq: Once | INTRAVENOUS | Status: AC
  Administered 2024-09-20: 333 mL via INTRAVENOUS

## 2024-09-20 MED ORDER — PRENATAL MULTIVITAMIN CH
1.0000 | ORAL_TABLET | Freq: Every day | ORAL | Status: DC
  Administered 2024-09-21 – 2024-09-22 (×2): 1 via ORAL
  Filled 2024-09-20 (×2): qty 1

## 2024-09-20 MED ORDER — ONDANSETRON HCL 4 MG/2ML IJ SOLN
4.0000 mg | Freq: Four times a day (QID) | INTRAMUSCULAR | Status: DC | PRN
  Administered 2024-09-20: 4 mg via INTRAVENOUS
  Filled 2024-09-20: qty 2

## 2024-09-20 MED ORDER — PHENYLEPHRINE 80 MCG/ML (10ML) SYRINGE FOR IV PUSH (FOR BLOOD PRESSURE SUPPORT)
80.0000 ug | PREFILLED_SYRINGE | INTRAVENOUS | Status: DC | PRN
  Filled 2024-09-20: qty 10

## 2024-09-20 MED ORDER — EPHEDRINE 5 MG/ML INJ: 10.0000 mg | INTRAVENOUS | Status: DC | PRN

## 2024-09-20 MED ORDER — EPHEDRINE 5 MG/ML INJ
10.0000 mg | INTRAVENOUS | Status: DC | PRN
  Filled 2024-09-20: qty 5

## 2024-09-20 MED ORDER — WITCH HAZEL-GLYCERIN EX PADS: 1.0000 | MEDICATED_PAD | CUTANEOUS | Status: DC | PRN

## 2024-09-20 MED ORDER — FENTANYL CITRATE (PF) 100 MCG/2ML IJ SOLN: 50.0000 ug | INTRAMUSCULAR | Status: DC | PRN

## 2024-09-20 MED ORDER — HYDROXYZINE HCL 50 MG PO TABS: 50.0000 mg | ORAL_TABLET | Freq: Four times a day (QID) | ORAL | Status: DC | PRN

## 2024-09-20 MED ORDER — TETANUS-DIPHTH-ACELL PERTUSSIS 5-2-15.5 LF-MCG/0.5 IM SUSP: 0.5000 mL | Freq: Once | INTRAMUSCULAR | Status: DC

## 2024-09-20 MED ORDER — DIBUCAINE (PERIANAL) 1 % EX OINT: 1.0000 | TOPICAL_OINTMENT | CUTANEOUS | Status: DC | PRN

## 2024-09-20 MED ORDER — FENTANYL-BUPIVACAINE-NACL 0.5-0.125-0.9 MG/250ML-% EP SOLN
12.0000 mL/h | EPIDURAL | Status: DC | PRN
  Administered 2024-09-20: 12 mL/h via EPIDURAL
  Filled 2024-09-20: qty 250

## 2024-09-20 MED ORDER — BENZOCAINE-MENTHOL 20-0.5 % EX AERO
1.0000 | INHALATION_SPRAY | CUTANEOUS | Status: DC | PRN
  Administered 2024-09-21 – 2024-09-22 (×2): 1 via TOPICAL
  Filled 2024-09-20 (×2): qty 56

## 2024-09-20 MED ORDER — SOD CITRATE-CITRIC ACID 500-334 MG/5ML PO SOLN: 30.0000 mL | ORAL | Status: DC | PRN

## 2024-09-20 MED ORDER — SIMETHICONE 80 MG PO CHEW: 80.0000 mg | CHEWABLE_TABLET | ORAL | Status: DC | PRN

## 2024-09-20 MED ORDER — DIPHENHYDRAMINE HCL 25 MG PO CAPS: 25.0000 mg | ORAL_CAPSULE | Freq: Four times a day (QID) | ORAL | Status: DC | PRN

## 2024-09-20 MED ORDER — OXYCODONE HCL 5 MG PO TABS: 5.0000 mg | ORAL_TABLET | ORAL | Status: DC | PRN

## 2024-09-20 MED ORDER — DIPHENHYDRAMINE HCL 50 MG/ML IJ SOLN: 12.5000 mg | INTRAMUSCULAR | Status: DC | PRN

## 2024-09-20 MED ORDER — ONDANSETRON HCL 4 MG/2ML IJ SOLN: 4.0000 mg | INTRAMUSCULAR | Status: DC | PRN

## 2024-09-20 MED ORDER — PHENYLEPHRINE 80 MCG/ML (10ML) SYRINGE FOR IV PUSH (FOR BLOOD PRESSURE SUPPORT): 80.0000 ug | PREFILLED_SYRINGE | INTRAVENOUS | Status: DC | PRN

## 2024-09-20 MED ORDER — LACTATED RINGERS IV SOLN: 500.0000 mL | INTRAVENOUS | Status: DC | PRN

## 2024-09-20 MED ORDER — LACTATED RINGERS IV SOLN
500.0000 mL | Freq: Once | INTRAVENOUS | Status: AC
  Administered 2024-09-20: 500 mL via INTRAVENOUS

## 2024-09-20 MED ORDER — OXYTOCIN-SODIUM CHLORIDE 30-0.9 UT/500ML-% IV SOLN
1.0000 m[IU]/min | INTRAVENOUS | Status: DC
  Administered 2024-09-20: 2 m[IU]/min via INTRAVENOUS
  Filled 2024-09-20: qty 500

## 2024-09-20 MED ORDER — IBUPROFEN 600 MG PO TABS
600.0000 mg | ORAL_TABLET | Freq: Four times a day (QID) | ORAL | Status: DC
  Administered 2024-09-21 – 2024-09-22 (×7): 600 mg via ORAL
  Filled 2024-09-20 (×7): qty 1

## 2024-09-20 MED ORDER — ONDANSETRON HCL 4 MG PO TABS: 4.0000 mg | ORAL_TABLET | ORAL | Status: DC | PRN

## 2024-09-20 MED ORDER — OXYTOCIN-SODIUM CHLORIDE 30-0.9 UT/500ML-% IV SOLN
2.5000 [IU]/h | INTRAVENOUS | Status: DC
  Administered 2024-09-20: 2.5 [IU]/h via INTRAVENOUS

## 2024-09-20 NOTE — H&P (Signed)
 Monique Odom is a 18 y.o. G2P0010 at [redacted]w[redacted]d presenting for IOL for FGR w/ non-reassuring antenatal testing (BPP 6/8, nml fluid).  Pregnancy complicated by: Anemia - ferritin 10 at 27w labs, s/p IV iron  x 2. Hgb 10.6 on 11/26 Newly Diagnosed FGR - US  today for S<D, EFW 15% (6lb5oz), AC 4%, BPP 6/8 (off breathing)  Patient reports intermittent abdominal cramping. Denies LOF or VB. Good fetal movement.   OB History     Gravida  2   Para      Term      Preterm      AB  1   Living         SAB      IAB      Ectopic      Multiple      Live Births           Obstetric Comments  2023 procedure, bled for a long time after        Past Medical History:  Diagnosis Date   Anxiety    Chlamydia    Depression    Past Surgical History:  Procedure Laterality Date   THERAPEUTIC ABORTION  05/2022   Family History: family history includes Hypertension in her mother; Other in her father. Social History:  reports that she has never smoked. She has never used smokeless tobacco. She reports that she does not currently use alcohol. She reports that she does not currently use drugs after having used the following drugs: Marijuana.     Maternal Diabetes: No Genetic Screening: Normal Maternal Ultrasounds/Referrals: Normal Fetal Ultrasounds or other Referrals:  None Maternal Substance Abuse:  No Significant Maternal Medications:  None Significant Maternal Lab Results:  None Number of Prenatal Visits:greater than 3 verified prenatal visits Maternal Vaccinations:TDap   Review of Systems  Constitutional:  Negative for chills, fatigue and fever.  Respiratory:  Negative for cough and shortness of breath.   Cardiovascular:  Negative for chest pain.  Gastrointestinal:  Negative for abdominal pain, diarrhea, nausea and vomiting.  Genitourinary:  Negative for hematuria and vaginal bleeding.  Skin:  Negative for rash.  Neurological:  Negative for syncope, light-headedness and  headaches.      Blood pressure 126/80, pulse 68, temperature 98.2 F (36.8 C), temperature source Oral, resp. rate 17, last menstrual period 12/25/2023.   SVE: 5/50/-2, Monique Odom  FHT: FHR 150, moderate variability, accelerations +, no deceleration. Toco: irregular Cat 1   Exam Physical Exam Constitutional:      Appearance: Normal appearance.  HENT:     Head: Normocephalic.     Mouth/Throat:     Mouth: Mucous membranes are moist.  Eyes:     Pupils: Pupils are equal, round, and reactive to light.  Cardiovascular:     Rate and Rhythm: Normal rate.  Pulmonary:     Effort: Pulmonary effort is normal.  Abdominal:     Comments: Gravid, non-tender  Musculoskeletal:        General: Normal range of motion.     Cervical back: Normal range of motion.  Skin:    General: Skin is warm.  Neurological:     General: No focal deficit present.     Mental Status: She is alert and oriented to person, place, and time.  Psychiatric:        Mood and Affect: Mood normal.        Behavior: Behavior normal.     Prenatal labs:  ABO, Rh: --/--/A POS (04/05 1650) Antibody:  Rubella:   immune RPR:   non-reactive HBsAg:   negative HIV:   non-reactive  GBS: Negative/-- (11/21 0000)   Assessment/Plan: G2P0010 at [redacted]w[redacted]d admitted for IOL for FGR w/ non-reassuring antenatal testing    Plan: - Pitocin ordered - AROM at next cervical exam - Epidural PRN - GBS negative, no prophylaxis indicated at this time  - Recheck in 2-4 hr or sooner PRN  Female fetus - Monique Odom 09/20/2024, 11:00 AM

## 2024-09-20 NOTE — Lactation Note (Signed)
 This note was copied from a baby's chart. Lactation Consultation Note  Patient Name: Monique Odom Date: 09/20/2024 Age:20 hours Reason for consult: Early term 37-38.6wks;1st time breastfeeding;Initial assessment P1, ETI, MOB latched infant on her left breast using the cross cradle hold,  infant was still breastfeeding after 12 minutes when LC left the room.MOB is unsure if she wants to continue to latch infant at the breast. She is thinking about exclusively pumping maybe consider donor breast milk. MOB knows to continue to feed infant by cues, on demand, 8-12 times within 24 hours, skin to skin. MOB knows to ask for further latch assistance on MOB if she decides to continue to latch infant at the breast.   Maternal Data Does the patient have breastfeeding experience prior to this delivery?: No  Feeding Mother's Current Feeding Choice: Breast Milk and Formula  LATCH Score Latch: Repeated attempts needed to sustain latch, nipple held in mouth throughout feeding, stimulation needed to elicit sucking reflex.  Audible Swallowing: A few with stimulation  Type of Nipple: Everted at rest and after stimulation  Comfort (Breast/Nipple): Soft / non-tender  Hold (Positioning): Assistance needed to correctly position infant at breast and maintain latch.  LATCH Score: 7   Lactation Tools Discussed/Used    Interventions Interventions: Assisted with latch;Skin to skin;Breast compression;Adjust position;Support pillows;Position options;Expressed milk;Education  Discharge    Consult Status Consult Status: Follow-up from L&D Follow-up type: In-patient    Grayce LULLA Batter 09/20/2024, 10:09 PM

## 2024-09-20 NOTE — Progress Notes (Signed)
 Monique Odom is a 18 y.o. G2P0010 at [redacted]w[redacted]d admitted for IOL for FGR w/ non-reassuring ANT  Subjective: Patient reports increased pain with contraction. Denies LOF or VB. + FM  Objective: BP 113/77   Pulse 73   Temp 98.2 F (36.8 C) (Oral)   Resp 18   Ht 5' 2 (1.575 m)   Wt 60.3 kg   LMP 12/25/2023   SpO2 100%   BMI 24.33 kg/m  No intake/output data recorded. No intake/output data recorded.  FHT:  FHR: 140 bpm, variability: moderate,  accelerations:  Present,  decelerations:  Absent UC:   regular, every 2-3 minutes  SVE:   Dilation: 5 Effacement (%): 50 Station: -2 Exam by:: Dr. Lane  Labs: Lab Results  Component Value Date   WBC 5.5 09/20/2024   HGB 10.6 (L) 09/20/2024   HCT 32.3 (L) 09/20/2024   MCV 85.0 09/20/2024   PLT 224 09/20/2024    Assessment / Plan: Induction of labor due to new FGR w/ non-reassuring fetal testing  Labor: Progressing on Pitocin, will continue to increase then AROM Fetal Wellbeing:  Category I Pain Control:  desires Epidural prior to AROM Anticipated MOD:  NSVD  Monique CHRISTELLA Lane, MD 09/20/2024, 4:17 PM

## 2024-09-20 NOTE — Anesthesia Procedure Notes (Addendum)
 Epidural Patient location during procedure: OB Start time: 09/20/2024 4:05 PM End time: 09/20/2024 4:08 PM  Staffing Anesthesiologist: Paul Lamarr BRAVO, MD Performed: anesthesiologist   Preanesthetic Checklist Completed: patient identified, IV checked, risks and benefits discussed, monitors and equipment checked, pre-op evaluation and timeout performed  Epidural Patient position: sitting Prep: DuraPrep and site prepped and draped Patient monitoring: continuous pulse ox, blood pressure and heart rate Approach: midline Location: L3-L4 Injection technique: LOR air  Needle:  Needle type: Tuohy  Needle gauge: 17 G Needle length: 9 cm Needle insertion depth: 4 cm Catheter type: closed end flexible Catheter size: 19 Gauge Catheter at skin depth: 9 cm Test dose: negative and Other (1% lidocaine )  Assessment Events: blood not aspirated, no cerebrospinal fluid, injection not painful, no injection resistance, no paresthesia and negative IV test  Additional Notes Patient identified. Risks, benefits, and alternatives discussed with patient including but not limited to bleeding, infection, nerve damage, paralysis, failed block, incomplete pain control, headache, blood pressure changes, nausea, vomiting, reactions to medication, itching, and postpartum back pain. Confirmed with bedside nurse the patient's most recent platelet count. Confirmed with patient that they are not currently taking any anticoagulation, have any bleeding history, or any family history of bleeding disorders. Patient expressed understanding and wished to proceed. All questions were answered. Sterile technique was used throughout the entire procedure. Please see nursing notes for vital signs.   Crisp LOR on first pass. Test dose was given through epidural catheter and negative prior to continuing to dose epidural or start infusion. Warning signs of high block given to the patient including shortness of breath,  tingling/numbness in hands, complete motor block, or any concerning symptoms with instructions to call for help. Patient was given instructions on fall risk and not to get out of bed. All questions and concerns addressed with instructions to call with any issues or inadequate analgesia.  Reason for block:procedure for pain

## 2024-09-20 NOTE — Anesthesia Preprocedure Evaluation (Signed)
 Anesthesia Evaluation  Patient identified by MRN, date of birth, ID band Patient awake    Reviewed: Allergy & Precautions, Patient's Chart, lab work & pertinent test results  History of Anesthesia Complications Negative for: history of anesthetic complications  Airway Mallampati: II  TM Distance: >3 FB Neck ROM: Full    Dental no notable dental hx.    Pulmonary neg pulmonary ROS   Pulmonary exam normal        Cardiovascular negative cardio ROS Normal cardiovascular exam     Neuro/Psych   Anxiety Depression    negative neurological ROS     GI/Hepatic negative GI ROS, Neg liver ROS,,,  Endo/Other  negative endocrine ROS    Renal/GU negative Renal ROS     Musculoskeletal negative musculoskeletal ROS (+)    Abdominal   Peds  Hematology  (+) Blood dyscrasia, anemia   Anesthesia Other Findings   Reproductive/Obstetrics (+) Pregnancy                              Anesthesia Physical Anesthesia Plan  ASA: 2  Anesthesia Plan: Epidural   Post-op Pain Management:    Induction:   PONV Risk Score and Plan: Treatment may vary due to age or medical condition  Airway Management Planned: Natural Airway  Additional Equipment: Fetal Monitoring  Intra-op Plan:   Post-operative Plan:   Informed Consent: I have reviewed the patients History and Physical, chart, labs and discussed the procedure including the risks, benefits and alternatives for the proposed anesthesia with the patient or authorized representative who has indicated his/her understanding and acceptance.       Plan Discussed with:   Anesthesia Plan Comments:          Anesthesia Quick Evaluation

## 2024-09-20 NOTE — Plan of Care (Signed)
  S:  Pain well controlled with epidrual. Denies LOF or VB. Reports good fetal movement.   O:  Vitals:   09/20/24 1640 09/20/24 1645  BP: 110/85 108/76  Pulse: 77 62  Resp:    Temp:    SpO2: 100% 100%    SVE:  6/70/-1 FHT: FHR 135, moderate variability, + acceleration, no declerations Toco: q3 min Cat 1   A/P   18 y.o. G2P0010 [redacted]w[redacted]d undergoing IOL for FGR w/ NR ANT  Pitocin since 13:18, continue to up-titrate Epidural placed AROM for clear fluid at 16:50 Recheck in 2 hr  Monique Odom

## 2024-09-21 ENCOUNTER — Encounter (HOSPITAL_COMMUNITY): Payer: Self-pay

## 2024-09-21 LAB — CBC
HCT: 28.2 % — ABNORMAL LOW (ref 36.0–46.0)
Hemoglobin: 9.3 g/dL — ABNORMAL LOW (ref 12.0–15.0)
MCH: 28.2 pg (ref 26.0–34.0)
MCHC: 33 g/dL (ref 30.0–36.0)
MCV: 85.5 fL (ref 80.0–100.0)
Platelets: 184 K/uL (ref 150–400)
RBC: 3.3 MIL/uL — ABNORMAL LOW (ref 3.87–5.11)
RDW: 16.8 % — ABNORMAL HIGH (ref 11.5–15.5)
WBC: 9.5 K/uL (ref 4.0–10.5)
nRBC: 0 % (ref 0.0–0.2)

## 2024-09-21 LAB — SYPHILIS: RPR W/REFLEX TO RPR TITER AND TREPONEMAL ANTIBODIES, TRADITIONAL SCREENING AND DIAGNOSIS ALGORITHM: RPR Ser Ql: NONREACTIVE

## 2024-09-21 NOTE — Anesthesia Postprocedure Evaluation (Signed)
 Anesthesia Post Note  Patient: Monique Odom  Procedure(s) Performed: AN AD HOC LABOR EPIDURAL     Patient location during evaluation: Mother Baby Anesthesia Type: Epidural Level of consciousness: awake and alert Pain management: pain level controlled Vital Signs Assessment: post-procedure vital signs reviewed and stable Respiratory status: spontaneous breathing, nonlabored ventilation and respiratory function stable Cardiovascular status: stable Postop Assessment: no headache, no backache and epidural receding Anesthetic complications: no   No notable events documented.  Last Vitals:  Vitals:   09/21/24 0048 09/21/24 0506  BP: 106/69 110/72  Pulse: 65 66  Resp: 18 18  Temp: 36.7 C 36.6 C  SpO2: 100% 100%    Last Pain:  Vitals:   09/21/24 0828  TempSrc:   PainSc: 0-No pain   Pain Goal:                Epidural/Spinal Function Cutaneous sensation: Normal sensation (09/21/24 0828), Patient able to flex knees: Yes (09/21/24 0828), Patient able to lift hips off bed: Yes (09/21/24 0828), Back pain beyond tenderness at insertion site: No (09/21/24 0828), Progressively worsening motor and/or sensory loss: No (09/21/24 0828), Bowel and/or bladder incontinence post epidural: No (09/21/24 9171)  Giancarlos Berendt

## 2024-09-21 NOTE — Progress Notes (Signed)
 Post Partum Day 1 Subjective: Doing well this morning. Has not ambulated yet because her left leg was still numb, this has just resolved. She has voided once will assistance in getting to the bathroom. Pain is well controlled. Tolerating fluids without N/V, has not yet attempted solid PO. Lochia appropriate. Denies lightheadedness. Breast and bottlefeeding; attempted pumping but it hurt; I recommended discussing with lactation consultant.  Objective:    09/21/2024    5:06 AM 09/21/2024   12:48 AM 09/20/2024   11:26 PM  Vitals with BMI  Systolic 110 106 890  Diastolic 72 69 69  Pulse 66 65 84     Physical Exam:  General: alert, cooperative, and no distress Lochia: appropriate Uterine Fundus: firm DVT Evaluation: No evidence of DVT seen on physical exam.  Recent Labs    09/20/24 1145 09/21/24 0240  HGB 10.6* 9.3*  HCT 32.3* 28.2*    Assessment/Plan: 18 year old G2 now P1011 s/p SVD at [redacted]w[redacted]d after IOL for FGR w/ non-reassuring antenatal testing  -PPD1: Doing well. Continue routine postpartum care. -Acute blood loss anemia, clinically significant for this hospitalization: Hgb 10.6-9.3. Plan PO iron  every other day at home.  Dispo: delivered late in the evening, anticipate discharge PPD2.    LOS: 1 day   Rubie DELENA Husky, MD 09/21/2024, 7:12 AM

## 2024-09-21 NOTE — Lactation Note (Signed)
 This note was copied from a baby's chart. Lactation Consultation Note  Patient Name: Monique Odom Unijb'd Date: 09/21/2024 Age:20 hours, P1  Reason for consult: Initial assessment;Early term 37-38.6wks;Infant < 6lbs;Infant weight loss;Primapara;1st time breastfeeding;Exclusive pumping and bottle feeding (1 % weight loss) As LC entered the room, mom was sorting through her hands free double pump  Paruu  and parts.  Per mom I have latched the baby, but now only want to pump and bottle feed. LC recommended the hospital DEBP to enhance her milk coming in quicker and mom receptive to having it set up.  LC set it up and checked the flange size. LC noted the nipple appears to be a #18 F . LC 1st tried the #60F and per mom it was uncomfortable so increased to #24 F and per mom more comfortable,  LC recommended after the baby is settled from the feeding, post pump both breast 8-10 times a day for 15 mins, and save the milk for the next feeding. LC reassured mom  pumping can be a slow process and up and down.    Maternal Data Does the patient have breastfeeding experience prior to this delivery?: No  Feeding Mother's Current Feeding Choice: Breast Milk and Formula Nipple Type: Nfant Slow Flow (purple)   Lactation Tools Discussed/Used Tools: Pump;Flanges Flange Size: 21;24;Other (comment) (per mom was not comfortable in the #21 F, #24 F was more comfortable. LC recommended using the coconut oil) Breast pump type: Double-Electric Breast Pump;Manual Pump Education: Setup, frequency, and cleaning;Milk Storage Reason for Pumping: per mom  latched the baby once and now only desires to pump and bottle feed  Interventions Interventions: Breast feeding basics reviewed;Coconut oil;Hand pump;DEBP;Education;LC Services brochure;CDC Guidelines for Breast Pump Cleaning;CDC milk storage guidelines  Discharge Pump: Hands Free;Personal;Manual  Consult Status Consult Status: Follow-up Date:  09/22/24    Rollene Jenkins Fiedler 09/21/2024, 2:50 PM

## 2024-09-22 MED ORDER — IBUPROFEN 600 MG PO TABS: 600.0000 mg | ORAL_TABLET | Freq: Four times a day (QID) | ORAL | 0 refills | Status: AC | PRN

## 2024-09-22 MED ORDER — ACETAMINOPHEN 325 MG PO TABS: 650.0000 mg | ORAL_TABLET | ORAL | Status: AC | PRN

## 2024-09-22 MED ORDER — POLYETHYLENE GLYCOL 3350 17 GM/SCOOP PO POWD: 17.0000 g | Freq: Every day | ORAL | Status: AC | PRN

## 2024-09-22 MED ORDER — FERROUS SULFATE 325 (65 FE) MG PO TABS: 325.0000 mg | ORAL_TABLET | ORAL | 0 refills | Status: AC

## 2024-09-22 NOTE — Discharge Summary (Signed)
 Postpartum Discharge Summary  Date of Service updated     Patient Name: Monique Odom DOB: 11-Nov-2005 MRN: [id number]  Date of admission: 09/20/2024 Delivery date:09/20/2024 Delivering provider: LANE CHARMAINE HERO Date of discharge: 09/22/2024  Admitting diagnosis: Pregnancy affected by fetal growth restriction [O36.5990] Intrauterine pregnancy: [redacted]w[redacted]d     Secondary diagnosis:  Principal Problem:   Pregnancy affected by fetal growth restriction  Additional problems: Anemia in pregnancy    Discharge diagnosis: Term Pregnancy Delivered, Anemia, and Fetal growth restriction                                              Post partum procedures:None Augmentation: AROM and Pitocin Complications: None  Hospital course: Induction of Labor With Vaginal Delivery   18 y.o. yo G2P1011 at [redacted]w[redacted]d was admitted to the hospital 09/20/2024 for induction of labor.  Indication for induction: FGR.  Patient had an uncomplicated labor course.  Membrane Rupture Time/Date: 4:50 PM,09/20/2024  Delivery Method:Vaginal, Spontaneous Operative Delivery:N/A Episiotomy: None Lacerations:  1st degree Details of delivery can be found in separate delivery note.  Patient had a postpartum course complicated by acute on chronic blood loss anemia. Patient is discharged home 09/22/24.  Newborn Data: Birth date:09/20/2024 Birth time:8:44 PM Gender:Female Living status:Living Apgars:9 ,9  Weight:2680 g  Magnesium Sulfate received: No BMZ received: No Rhophylac:N/A MMR:N/A T-DaP:Given prenatally Flu: No RSV Vaccine received: No Transfusion:No Immunizations administered: There is no immunization history for the selected administration types on file for this patient.  Physical exam  Vitals:   09/21/24 0506 09/21/24 1349 09/21/24 2017 09/22/24 0510  BP: 110/72 109/71 (!) 106/90 106/62  Pulse: 66 67 87 83  Resp: 18 17 18 18   Temp: 97.9 F (36.6 C) 98.6 F (37 C) 98.2 F (36.8 C) 98.2 F (36.8 C)   TempSrc: Oral  Oral Oral  SpO2: 100% 99% 99% 99%  Weight:      Height:       General: alert, cooperative, and no distress Lochia: appropriate Uterine Fundus: firm Incision: N/A DVT Evaluation: No cords or calf tenderness. No significant calf/ankle edema. Labs: Lab Results  Component Value Date   WBC 9.5 09/21/2024   HGB 9.3 (L) 09/21/2024   HCT 28.2 (L) 09/21/2024   MCV 85.5 09/21/2024   PLT 184 09/21/2024      Latest Ref Rng & Units 05/27/2021    2:08 PM  CMP  Glucose 70 - 99 mg/dL 81   BUN 4 - 18 mg/dL 8   Creatinine 9.49 - 8.99 mg/dL 9.26   Sodium 864 - 854 mmol/L 138   Potassium 3.5 - 5.1 mmol/L 4.0   Chloride 98 - 111 mmol/L 106   CO2 22 - 32 mmol/L 23   Calcium 8.9 - 10.3 mg/dL 89.9   Total Protein 6.5 - 8.1 g/dL 8.9   Total Bilirubin 0.3 - 1.2 mg/dL 1.6   Alkaline Phos 47 - 119 U/L 50   AST 15 - 41 U/L 18   ALT 0 - 44 U/L 13    Edinburgh Score:    09/22/2024   12:22 AM  Edinburgh Postnatal Depression Scale Screening Tool  I have been able to laugh and see the funny side of things. 0  I have looked forward with enjoyment to things. 1  I have blamed myself unnecessarily when things went wrong. 1  I  have been anxious or worried for no good reason. 2  I have felt scared or panicky for no good reason. 0  Things have been getting on top of me. 1  I have been so unhappy that I have had difficulty sleeping. 2  I have felt sad or miserable. 1  I have been so unhappy that I have been crying. 1  The thought of harming myself has occurred to me. 0  Edinburgh Postnatal Depression Scale Total 9      After visit meds:  Allergies as of 09/22/2024   No Known Allergies      Medication List     STOP taking these medications    cyclobenzaprine  10 MG tablet Commonly known as: FLEXERIL    ondansetron  4 MG disintegrating tablet Commonly known as: ZOFRAN -ODT       TAKE these medications    acetaminophen  325 MG tablet Commonly known as: Tylenol  Take 2  tablets (650 mg total) by mouth every 4 (four) hours as needed for moderate pain (pain score 4-6).   ferrous sulfate 325 (65 FE) MG tablet Take 1 tablet (325 mg total) by mouth every other day.   ibuprofen 600 MG tablet Commonly known as: ADVIL Take 1 tablet (600 mg total) by mouth every 6 (six) hours as needed for cramping.   polyethylene glycol powder 17 GM/SCOOP powder Commonly known as: GLYCOLAX/MIRALAX Take 17 g by mouth daily as needed for mild constipation. Dissolve 1 capful (17g) in 4-8 ounces of liquid and take by mouth daily.   prenatal vitamin w/FE, FA 27-1 MG Tabs tablet Take 1 tablet by mouth daily at 12 noon.         Discharge home in stable condition Infant Feeding: Bottle and Breast Infant Disposition:home with mother Discharge instruction: per After Visit Summary and Postpartum booklet. Activity: Advance as tolerated. Pelvic rest for 6 weeks.  Diet: routine diet Anticipated Birth Control: Unsure Postpartum Appointment:6 weeks Additional Postpartum F/U: Postpartum Depression checkup Future Appointments:No future appointments. Follow up Visit:  Follow-up Information     Ob/Gyn, [name] Follow up in 6 week(s).   Contact information: [address] [place] [address] [phone]                     09/22/2024 Larraine DELENA Sharps, DO

## 2024-09-22 NOTE — Discharge Instructions (Signed)
 Call office with any concerns [phone]

## 2024-09-22 NOTE — Lactation Note (Signed)
 This note was copied from a baby's chart. Lactation Consultation Note  Patient Name: Monique Odom Date: 09/22/2024 Age:20 hours Reason for consult: Follow-up assessment;Early term 37-38.6wks;Primapara;1st time breastfeeding;Infant < 6lbs;Infant weight loss;Nipple pain/trauma (3% weight loss) Per mom latched the baby around the 4 am feeding for about 7 mins.  Per mom nipples are still tender. Using coconut oil and only has pumped x 1. LC provided shells for between feedings except when sleeping. And per mom plans to get pumping at home today.  LC reviewed breast feeding D/C teaching ( see below )  LC requested and LC O/P with moms consent, now latching some and plans to pump.  Mom aware of the [facility] resources.  Maternal Data Has patient been taught Hand Expression?:  (and reverse pressue due to areola edema) Does the patient have breastfeeding experience prior to this delivery?: No  Feeding Mother's Current Feeding Choice: Breast Milk and Formula Nipple Type: Extra Slow Flow  LATCH Score  Per mom latched the baby around the 4 am feeding for about 7 mins.  Per mom nipples are still tender. Using coconut oil and only has pumped x 1  Lactation Tools Discussed/Used Tools: Pump;Shells Flange Size: 21;24 Breast pump type: Double-Electric Breast Pump;Manual Pump Education: Setup, frequency, and cleaning;Milk Storage Reason for Pumping: 12/3 mom desired to pump Pumped volume:  (per mom  only has pumped x 1 since yesterday)  Interventions Interventions: Breast feeding basics reviewed;Reverse pressure;Coconut oil;Shells;Hand pump;DEBP;Education;LC Services brochure;CDC milk storage guidelines;CDC Guidelines for Breast Pump Cleaning  Discharge Discharge Education: Engorgement and breast care;Warning signs for feeding baby;Outpatient recommendation;Outpatient Epic message sent [facility] offered to request and LC O/P and mom receptive) Pump: Manual;Hands Free;Personal  Consult  Status Consult Status: Complete Date: 09/22/24    Rollene Caldron Bryten Maher 09/22/2024, 9:01 AM

## 2024-09-22 NOTE — Progress Notes (Addendum)
 Post Partum Day 2 Subjective: Nipples sore, breastfeeding improving. Bleeding light. Pain controlled  Objective: Blood pressure 106/62, pulse 83, temperature 98.2 F (36.8 C), temperature source Oral, resp. rate 18, height 5' 2 (1.575 m), weight 60.3 kg, last menstrual period 12/25/2023, SpO2 99%, unknown if currently breastfeeding.  Physical Exam:  General: alert, cooperative, and no distress Lochia: appropriate Uterine Fundus: firm DVT Evaluation: No cords or calf tenderness. No significant calf/ankle edema.  Recent Labs    09/20/24 1145 09/21/24 0240  HGB 10.6* 9.3*  HCT 32.3* 28.2*    Assessment/Plan: 18 yo G2 now P1011 PPD#2 s/p NSVD following IOL for FGR - PP: doing well - Acute on chronic anemia, clinically significant for this hospitalization. Hb 9.3, will DC on oral iron  - Dispo: DC home   LOS: 2 days   Monique Odom Sharps, DO 09/22/2024, 9:19 AM

## 2024-09-22 NOTE — Progress Notes (Signed)
 MOB was referred for history of depression/anxiety.  * Referral screened out by Clinical Social Worker because none of the following criteria appear to apply:  ~ History of anxiety/depression during this pregnancy, or of post-partum depression following prior delivery.  ~ Diagnosis of anxiety and/or depression within last 3 years  Per OB notes, MOB did not indicate any signs/symptoms during her pregnancy.  Edinburgh score 9  Anxiety/Depression 2021  OR  * MOB's symptoms currently being treated with medication and/or therapy.  Please contact the Clinical Social Worker if needs arise, by [facility] request, or if MOB scores greater than 9/yes to question 10 on Edinburgh Postpartum Depression Screen.  Rosina Molt, [place] Clinical Social Worker [phone]

## 2024-09-28 ENCOUNTER — Telehealth (HOSPITAL_COMMUNITY): Payer: Self-pay | Admitting: *Deleted

## 2024-09-28 NOTE — Telephone Encounter (Signed)
 09/28/2024  Name: Monique Odom MRN: [id number] DOB: 2006/10/18  Reason for Call:  Transition of Care Hospital Discharge Call  Contact Status: Patient Contact Status: Message  Language assistant needed:          Follow-Up Questions:    Van Postnatal Depression Scale:  In the Past 7 Days:    PHQ2-9 Depression Scale:     Discharge Follow-up:    Post-discharge interventions: NA  Erric Machnik,RN  09/28/2024 1604

## 2024-09-29 LAB — BIRTH TISSUE RECOVERY COLLECTION (PLACENTA DONATION)
